# Patient Record
Sex: Female | Born: 1960 | Race: White | Hispanic: No | Marital: Married | Smoking: Current every day smoker
Health system: Southern US, Community
[De-identification: ages and names within clinical notes are randomized; demographics above are authoritative.]

## PROBLEM LIST (undated history)

## (undated) DIAGNOSIS — M2550 Pain in unspecified joint: Secondary | ICD-10-CM

## (undated) DIAGNOSIS — M2540 Effusion, unspecified joint: Secondary | ICD-10-CM

## (undated) DIAGNOSIS — M62838 Other muscle spasm: Secondary | ICD-10-CM

## (undated) DIAGNOSIS — Z8619 Personal history of other infectious and parasitic diseases: Secondary | ICD-10-CM

---

## 2005-02-14 ENCOUNTER — Emergency Department (HOSPITAL_COMMUNITY): Admission: EM | Admit: 2005-02-14 | Discharge: 2005-02-14 | Payer: Self-pay | Admitting: Family Medicine

## 2013-07-11 DIAGNOSIS — Z8619 Personal history of other infectious and parasitic diseases: Secondary | ICD-10-CM

## 2013-07-11 HISTORY — DX: Personal history of other infectious and parasitic diseases: Z86.19

## 2015-08-16 ENCOUNTER — Emergency Department (HOSPITAL_COMMUNITY): Payer: Self-pay

## 2015-08-16 ENCOUNTER — Emergency Department (HOSPITAL_COMMUNITY)
Admission: EM | Admit: 2015-08-16 | Discharge: 2015-08-16 | Disposition: A | Discharge status: Home / Self Care | Payer: Self-pay | Attending: Emergency Medicine | Admitting: Emergency Medicine

## 2015-08-16 ENCOUNTER — Encounter (HOSPITAL_COMMUNITY): Payer: Self-pay | Admitting: Physical Medicine and Rehabilitation

## 2015-08-16 DIAGNOSIS — Y999 Unspecified external cause status: Secondary | ICD-10-CM | POA: Insufficient documentation

## 2015-08-16 DIAGNOSIS — M25519 Pain in unspecified shoulder: Secondary | ICD-10-CM

## 2015-08-16 DIAGNOSIS — S42291A Other displaced fracture of upper end of right humerus, initial encounter for closed fracture: Secondary | ICD-10-CM | POA: Insufficient documentation

## 2015-08-16 DIAGNOSIS — W010XXA Fall on same level from slipping, tripping and stumbling without subsequent striking against object, initial encounter: Secondary | ICD-10-CM | POA: Insufficient documentation

## 2015-08-16 DIAGNOSIS — S42301A Unspecified fracture of shaft of humerus, right arm, initial encounter for closed fracture: Secondary | ICD-10-CM

## 2015-08-16 DIAGNOSIS — Y92009 Unspecified place in unspecified non-institutional (private) residence as the place of occurrence of the external cause: Secondary | ICD-10-CM | POA: Insufficient documentation

## 2015-08-16 DIAGNOSIS — Y9301 Activity, walking, marching and hiking: Secondary | ICD-10-CM | POA: Insufficient documentation

## 2015-08-16 DIAGNOSIS — F17210 Nicotine dependence, cigarettes, uncomplicated: Secondary | ICD-10-CM | POA: Insufficient documentation

## 2015-08-16 MED ORDER — FENTANYL CITRATE (PF) 100 MCG/2ML IJ SOLN
100.0000 ug | Freq: Once | INTRAMUSCULAR | Status: AC
  Administered 2015-08-16: 100 ug via INTRAVENOUS
  Filled 2015-08-16: qty 2

## 2015-08-16 MED ORDER — OXYCODONE HCL 5 MG PO TABS: 5.0000 mg | ORAL_TABLET | ORAL | Status: DC | PRN

## 2015-08-16 MED ORDER — HYDROMORPHONE HCL 1 MG/ML IJ SOLN
1.0000 mg | Freq: Once | INTRAMUSCULAR | Status: AC
  Administered 2015-08-16: 1 mg via INTRAVENOUS
  Filled 2015-08-16: qty 1

## 2015-08-16 MED ORDER — OXYCODONE HCL 5 MG PO TABS
10.0000 mg | ORAL_TABLET | Freq: Once | ORAL | Status: AC
  Administered 2015-08-16: 10 mg via ORAL
  Filled 2015-08-16: qty 2

## 2015-08-16 MED ORDER — SODIUM CHLORIDE 0.9 % IV BOLUS (SEPSIS)
1000.0000 mL | Freq: Once | INTRAVENOUS | Status: AC
  Administered 2015-08-16: 1000 mL via INTRAVENOUS

## 2015-08-16 MED ORDER — DIAZEPAM 5 MG/ML IJ SOLN
2.5000 mg | Freq: Once | INTRAMUSCULAR | Status: AC
  Administered 2015-08-16: 2.5 mg via INTRAVENOUS
  Filled 2015-08-16: qty 2

## 2015-08-16 MED ORDER — DIAZEPAM 2 MG PO TABS: 2.0000 mg | ORAL_TABLET | Freq: Four times a day (QID) | ORAL | Status: DC | PRN

## 2015-08-16 NOTE — ED Notes (Signed)
Pt reports she accidentally tripped and fell on while walking inside of house. Denies LOC. Now reports R upper arm pain. Able to wiggle digits. Capillary refill less than 2 seconds. Pt is alert and oriented x4.

## 2015-08-16 NOTE — ED Notes (Signed)
Off unit with xray. 

## 2015-08-16 NOTE — ED Provider Notes (Signed)
CSN: [id number]     Arrival date & time 08/16/15  9562 History   First MD Initiated Contact with Patient 08/16/15 0940     Chief Complaint  Patient presents with  . Fall  . Arm Pain     (Consider location/radiation/quality/duration/timing/severity/associated sxs/prior Treatment) Patient is a 55 y.o. female presenting with shoulder pain.  Shoulder Pain Location:  Shoulder Injury: yes   Shoulder location:  R shoulder Pain details:    Radiates to:  R elbow   Severity:  Mild   Onset quality:  Sudden   Timing:  Constant Chronicity:  New Prior injury to area:  No Associated symptoms: no fatigue and no fever     History reviewed. No pertinent past medical history. History reviewed. No pertinent past surgical history. No family history on file. Social History  Substance Use Topics  . Smoking status: Current Every Day Smoker    Types: Cigarettes  . Smokeless tobacco: None  . Alcohol Use: No   OB History    No data available     Review of Systems  Constitutional: Negative for fever and fatigue.  Musculoskeletal:       Right shoulder pain  All other systems reviewed and are negative.     Allergies  Review of patient's allergies indicates no known allergies.  Home Medications   Prior to Admission medications   Medication Sig Start Date End Date Taking? Authorizing Provider  acetaminophen (TYLENOL) 325 MG tablet Take 650 mg by mouth every 6 (six) hours as needed for mild pain.   Yes Historical Provider, MD  diazepam (VALIUM) 2 MG tablet Take 1 tablet (2 mg total) by mouth every 6 (six) hours as needed for muscle spasms. 08/16/15   Marily Memos, MD  oxyCODONE (ROXICODONE) 5 MG immediate release tablet Take 1-2 tablets (5-10 mg total) by mouth every 4 (four) hours as needed for severe pain. 08/16/15   Marily Memos, MD   BP 146/93 mmHg  Pulse 85  Temp(Src) 97.5 F (36.4 C) (Oral)  Resp 18  Ht  (1.626 m)  Wt 200 lb (90.719 kg)  BMI 34.31 kg/m2  SpO2 93% Physical  Exam  Constitutional: She appears well-developed and well-nourished.  HENT:  Head: Normocephalic and atraumatic.  Neck: Normal range of motion.  Cardiovascular: Normal rate and regular rhythm.   Pulmonary/Chest: Effort normal. No stridor. No respiratory distress.  Abdominal: Soft. She exhibits no distension.  Musculoskeletal: She exhibits tenderness (anterior right shoulder, worse with ROM, possibly slight deformity unclear if intentional or anatomic , no ttp rest of arm past mid humerus). She exhibits no edema.  Neurological: She is alert.  Nursing note and vitals reviewed.   ED Course  Procedures (including critical care time) Labs Review Labs Reviewed - No data to display  Imaging Review Dg Shoulder Right  08/16/2015  CLINICAL DATA:  reports she accidentally tripped and fell on while walking inside of house EXAM: RIGHT SHOULDER - 2+ VIEW COMPARISON:  None. FINDINGS: Oblique fracture through the surgical neck of the proximal RIGHT humerus. The glenohumeral joint appears intact. There is mild override of the fracture fragments. IMPRESSION: Oblique fracture of the proximal humeral neck. Electronically Signed   By: Genevive Bi M.D.   On: 08/16/2015 11:35   Dg Humerus Right  08/16/2015  CLINICAL DATA:  Patient status post fall on ice.  Initial encounter. EXAM: RIGHT HUMERUS - 2+ VIEW COMPARISON:  None. FINDINGS: [id number] foreshortened and displaced comminuted fracture through the proximal right humeral  diaphysis. The distal humerus appears unremarkable. Visualize right hemi thorax is unremarkable. IMPRESSION: Foreshortened displaced comminuted proximal right humerus fracture. Electronically Signed   By: Annia Belt M.D.   On: 08/16/2015 11:33   I have personally reviewed and evaluated these images and lab results as part of my medical decision-making.   EKG Interpretation None      MDM   Final diagnoses:  Shoulder pain  Humerus fracture, right, closed, initial encounter   fx  with splinting vs dislocation. NVI.  xr w/ e/o fracture. Still NVI, normal sensation, able to move all joints. D/W orthopedics who wants to see the patient in the office this week for follow up.     Marily Memos, MD 08/16/15 1247

## 2015-08-16 NOTE — ED Notes (Signed)
Pt verbalizes understandiung of instructions. hom e by Derek Jack

## 2015-08-18 ENCOUNTER — Other Ambulatory Visit (HOSPITAL_COMMUNITY): Payer: Self-pay | Admitting: Orthopedic Surgery

## 2015-08-24 ENCOUNTER — Encounter (HOSPITAL_COMMUNITY)
Admission: RE | Admit: 2015-08-24 | Discharge: 2015-08-24 | Disposition: A | Discharge status: Home / Self Care | Payer: Self-pay | Source: Ambulatory Visit | Attending: Orthopedic Surgery | Admitting: Orthopedic Surgery

## 2015-08-24 ENCOUNTER — Encounter (HOSPITAL_COMMUNITY): Payer: Self-pay

## 2015-08-24 DIAGNOSIS — X58XXXA Exposure to other specified factors, initial encounter: Secondary | ICD-10-CM | POA: Insufficient documentation

## 2015-08-24 DIAGNOSIS — Z01812 Encounter for preprocedural laboratory examination: Secondary | ICD-10-CM | POA: Insufficient documentation

## 2015-08-24 DIAGNOSIS — S42201A Unspecified fracture of upper end of right humerus, initial encounter for closed fracture: Secondary | ICD-10-CM | POA: Insufficient documentation

## 2015-08-24 HISTORY — DX: Effusion, unspecified joint: M25.40

## 2015-08-24 HISTORY — DX: Other muscle spasm: M62.838

## 2015-08-24 HISTORY — DX: Personal history of other infectious and parasitic diseases: Z86.19

## 2015-08-24 HISTORY — DX: Pain in unspecified joint: M25.50

## 2015-08-24 LAB — BASIC METABOLIC PANEL
Anion gap: 13 (ref 5–15)
BUN: 10 mg/dL (ref 6–20)
CHLORIDE: 103 mmol/L (ref 101–111)
CO2: 26 mmol/L (ref 22–32)
CREATININE: 0.64 mg/dL (ref 0.44–1.00)
Calcium: 9.7 mg/dL (ref 8.9–10.3)
GFR calc Af Amer: >60 mL/min (ref >60)
GFR calc non Af Amer: >60 mL/min (ref >60)
GLUCOSE: 104 mg/dL — AB (ref 65–99)
Potassium: 4 mmol/L (ref 3.5–5.1)
SODIUM: 142 mmol/L (ref 135–145)

## 2015-08-24 LAB — CBC
HCT: 40.1 % (ref 36.0–46.0)
Hemoglobin: 12.9 g/dL (ref 12.0–15.0)
MCH: 26.8 pg (ref 26.0–34.0)
MCHC: 32.2 g/dL (ref 30.0–36.0)
MCV: 83.2 fL (ref 78.0–100.0)
PLATELETS: 413 10*3/uL — AB (ref 150–400)
RBC: 4.82 MIL/uL (ref 3.87–5.11)
RDW: 14.1 % (ref 11.5–15.5)
WBC: 12.4 10*3/uL — ABNORMAL HIGH (ref 4.0–10.5)

## 2015-08-24 MED ORDER — CEFAZOLIN SODIUM-DEXTROSE 2-3 GM-% IV SOLR
2.0000 g | INTRAVENOUS | Status: AC
  Administered 2015-08-25: 2 g via INTRAVENOUS
  Filled 2015-08-24: qty 50

## 2015-08-24 MED ORDER — CHLORHEXIDINE GLUCONATE 4 % EX LIQD: 60.0000 mL | Freq: Once | CUTANEOUS | Status: DC

## 2015-08-24 NOTE — Anesthesia Preprocedure Evaluation (Addendum)
Anesthesia Evaluation  Patient identified by MRN, date of birth, ID band Patient awake    Reviewed: Allergy & Precautions, NPO status , Patient's Chart, lab work & pertinent test results  Airway Mallampati: III  TM Distance: >3 FB Neck ROM: Full    Dental  (+) Teeth Intact   Pulmonary Current Smoker,    breath sounds clear to auscultation       Cardiovascular negative cardio ROS   Rhythm:Regular Rate:Normal     Neuro/Psych negative neurological ROS  negative psych ROS   GI/Hepatic negative GI ROS, Neg liver ROS,   Endo/Other  negative endocrine ROS  Renal/GU negative Renal ROS  negative genitourinary   Musculoskeletal negative musculoskeletal ROS (+)   Abdominal   Peds negative pediatric ROS (+)  Hematology negative hematology ROS (+)   Anesthesia Other Findings   Reproductive/Obstetrics negative OB ROS                            Lab Results  Component Value Date   WBC 12.4* 08/24/2015   HGB 12.9 08/24/2015   HCT 40.1 08/24/2015   MCV 83.2 08/24/2015   PLT 413* 08/24/2015   Lab Results  Component Value Date   CREATININE 0.64 08/24/2015   BUN 10 08/24/2015   NA 142 08/24/2015   K 4.0 08/24/2015   CL 103 08/24/2015   CO2 26 08/24/2015   No results found for: INR, PROTIME   Anesthesia Physical Anesthesia Plan  ASA: III  Anesthesia Plan: General and Regional   Post-op Pain Management: GA combined w/ Regional for post-op pain   Induction: Intravenous  Airway Management Planned: Oral ETT  Additional Equipment:   Intra-op Plan:   Post-operative Plan: Extubation in OR  Informed Consent: I have reviewed the patients History and Physical, chart, labs and discussed the procedure including the risks, benefits and alternatives for the proposed anesthesia with the patient or authorized representative who has indicated his/her understanding and acceptance.   Dental  advisory given  Plan Discussed with: CRNA  Anesthesia Plan Comments:         Anesthesia Quick Evaluation

## 2015-08-24 NOTE — Progress Notes (Signed)
Cardiologist denies  Doesn't have a primary physician  Echo denies  Stress test denies  Heart cath denies  EKG denies in past yr  CXR denies in past yr

## 2015-08-24 NOTE — Pre-Procedure Instructions (Signed)
Deborah Kramer  08/24/2015      [id number] - [place], [place] - [address] [facility] [address] Phone: [phone] Fax: [phone]    Your procedure is scheduled on Tues, Feb 14 @ 7:30 AM  Report to [facility] Admitting at 5:30 AM  Call this number if you have problems the morning of surgery:  [phone]   Remember:  Do not eat food or drink liquids after midnight.  Take these medicines the morning of surgery with A SIP OF WATER Valium(Diazepam) and Pain Pill(if needed)               No Goody's,BC's,Aleve,Aspirin,Ibuprofen,Advil,Motrin,Fish Oil,or any Herbal Medications.    Do not wear jewelry, make-up or nail polish.  Do not wear lotions, powders, or perfumes.  You may wear deodorant.  Do not shave 48 hours prior to surgery.    Do not bring valuables to the hospital.  [facility] is not responsible for any belongings or valuables.  Contacts, dentures or bridgework may not be worn into surgery.  Leave your suitcase in the car.  After surgery it may be brought to your room.  For patients admitted to the hospital, discharge time will be determined by your treatment team.  Patients discharged the day of surgery will not be allowed to drive home.    Special instructions:  [facility] - Preparing for Surgery  Before surgery, you can play an important role.  Because skin is not sterile, your skin needs to be as free of germs as possible.  You can reduce the number of germs on you skin by washing with CHG (chlorahexidine gluconate) soap before surgery.  CHG is an antiseptic cleaner which kills germs and bonds with the skin to continue killing germs even after washing.  Please DO NOT use if you have an allergy to CHG or antibacterial soaps.  If your skin becomes reddened/irritated stop using the CHG and inform your nurse when you arrive at Short Stay.  Do not shave (including legs and underarms) for at least 48 hours prior to the  first CHG shower.  You may shave your face.  Please follow these instructions carefully:   1.  Shower with CHG Soap the night before surgery and the                                morning of Surgery.  2.  If you choose to wash your hair, wash your hair first as usual with your       normal shampoo.  3.  After you shampoo, rinse your hair and body thoroughly to remove the                      Shampoo.  4.  Use CHG as you would any other liquid soap.  You can apply chg directly       to the skin and wash gently with scrungie or a clean washcloth.  5.  Apply the CHG Soap to your body ONLY FROM THE NECK DOWN.        Do not use on open wounds or open sores.  Avoid contact with your eyes,       ears, mouth and genitals (private parts).  Wash genitals (private parts)       with your normal soap.  6.  Wash thoroughly, paying special attention  to the area where your surgery        will be performed.  7.  Thoroughly rinse your body with warm water from the neck down.  8.  DO NOT shower/wash with your normal soap after using and rinsing off       the CHG Soap.  9.  Pat yourself dry with a clean towel.            10.  Wear clean pajamas.            11.  Place clean sheets on your bed the night of your first shower and do not        sleep with pets.  Day of Surgery  Do not apply any lotions/deoderants the morning of surgery.  Please wear clean clothes to the hospital/surgery center.    Please read over the following fact sheets that you were given. Pain Booklet, Coughing and Deep Breathing and Surgical Site Infection Prevention

## 2015-08-25 ENCOUNTER — Encounter (HOSPITAL_COMMUNITY)
Admission: RE | Disposition: A | Discharge status: Home / Self Care | Payer: Self-pay | Source: Ambulatory Visit | Attending: Orthopedic Surgery

## 2015-08-25 ENCOUNTER — Ambulatory Visit (HOSPITAL_COMMUNITY): Payer: Self-pay

## 2015-08-25 ENCOUNTER — Ambulatory Visit (HOSPITAL_COMMUNITY): Payer: Self-pay | Admitting: Anesthesiology

## 2015-08-25 ENCOUNTER — Ambulatory Visit (HOSPITAL_COMMUNITY)
Admission: RE | Admit: 2015-08-25 | Discharge: 2015-08-25 | Disposition: A | Discharge status: Home / Self Care | Payer: Self-pay | Source: Ambulatory Visit | Attending: Orthopedic Surgery | Admitting: Orthopedic Surgery

## 2015-08-25 ENCOUNTER — Encounter (HOSPITAL_COMMUNITY): Payer: Self-pay

## 2015-08-25 DIAGNOSIS — W000XXA Fall on same level due to ice and snow, initial encounter: Secondary | ICD-10-CM | POA: Insufficient documentation

## 2015-08-25 DIAGNOSIS — F17210 Nicotine dependence, cigarettes, uncomplicated: Secondary | ICD-10-CM | POA: Insufficient documentation

## 2015-08-25 DIAGNOSIS — S49091A Other physeal fracture of upper end of humerus, right arm, initial encounter for closed fracture: Secondary | ICD-10-CM | POA: Insufficient documentation

## 2015-08-25 DIAGNOSIS — Z791 Long term (current) use of non-steroidal anti-inflammatories (NSAID): Secondary | ICD-10-CM | POA: Insufficient documentation

## 2015-08-25 DIAGNOSIS — Z419 Encounter for procedure for purposes other than remedying health state, unspecified: Secondary | ICD-10-CM

## 2015-08-25 HISTORY — PX: ORIF HUMERUS FRACTURE: SHX2126

## 2015-08-25 SURGERY — OPEN REDUCTION INTERNAL FIXATION (ORIF) PROXIMAL HUMERUS FRACTURE
Anesthesia: Regional | Site: Shoulder | Laterality: Right

## 2015-08-25 MED ORDER — DIAZEPAM 2 MG PO TABS: 5.0000 mg | ORAL_TABLET | Freq: Three times a day (TID) | ORAL | Status: AC | PRN

## 2015-08-25 MED ORDER — PROPOFOL 10 MG/ML IV BOLUS
INTRAVENOUS | Status: AC
  Filled 2015-08-25: qty 40

## 2015-08-25 MED ORDER — PHENYLEPHRINE 40 MCG/ML (10ML) SYRINGE FOR IV PUSH (FOR BLOOD PRESSURE SUPPORT)
PREFILLED_SYRINGE | INTRAVENOUS | Status: AC
  Filled 2015-08-25: qty 10

## 2015-08-25 MED ORDER — HYDROMORPHONE HCL 1 MG/ML IJ SOLN
INTRAMUSCULAR | Status: AC
  Administered 2015-08-25: 0.5 mg via INTRAVENOUS
  Filled 2015-08-25: qty 1

## 2015-08-25 MED ORDER — PROPOFOL 10 MG/ML IV BOLUS
INTRAVENOUS | Status: DC | PRN
  Administered 2015-08-25 (×3): 10 mg via INTRAVENOUS
  Administered 2015-08-25: 150 mg via INTRAVENOUS
  Administered 2015-08-25: 50 mg via INTRAVENOUS
  Administered 2015-08-25 (×6): 10 mg via INTRAVENOUS

## 2015-08-25 MED ORDER — EPHEDRINE SULFATE 50 MG/ML IJ SOLN
INTRAMUSCULAR | Status: AC
  Filled 2015-08-25: qty 1

## 2015-08-25 MED ORDER — PROMETHAZINE HCL 25 MG/ML IJ SOLN: 6.2500 mg | INTRAMUSCULAR | Status: DC | PRN

## 2015-08-25 MED ORDER — OXYCODONE HCL 5 MG PO TABS: 5.0000 mg | ORAL_TABLET | ORAL | Status: AC | PRN

## 2015-08-25 MED ORDER — ROCURONIUM BROMIDE 50 MG/5ML IV SOLN
INTRAVENOUS | Status: AC
  Filled 2015-08-25: qty 2

## 2015-08-25 MED ORDER — PHENYLEPHRINE HCL 10 MG/ML IJ SOLN
INTRAMUSCULAR | Status: DC | PRN
  Administered 2015-08-25 (×4): 40 ug via INTRAVENOUS
  Administered 2015-08-25: 80 ug via INTRAVENOUS
  Administered 2015-08-25: 40 ug via INTRAVENOUS
  Administered 2015-08-25: 80 ug via INTRAVENOUS
  Administered 2015-08-25 (×2): 40 ug via INTRAVENOUS
  Administered 2015-08-25: 80 ug via INTRAVENOUS

## 2015-08-25 MED ORDER — STERILE WATER FOR INJECTION IJ SOLN
INTRAMUSCULAR | Status: AC
  Filled 2015-08-25: qty 10

## 2015-08-25 MED ORDER — 0.9 % SODIUM CHLORIDE (POUR BTL) OPTIME
TOPICAL | Status: DC | PRN
  Administered 2015-08-25 (×2): 1000 mL

## 2015-08-25 MED ORDER — BUPIVACAINE HCL (PF) 0.25 % IJ SOLN
INTRAMUSCULAR | Status: AC
  Filled 2015-08-25: qty 30

## 2015-08-25 MED ORDER — DEXAMETHASONE SODIUM PHOSPHATE 10 MG/ML IJ SOLN
INTRAMUSCULAR | Status: DC | PRN
  Administered 2015-08-25: 6 mg via INTRAVENOUS

## 2015-08-25 MED ORDER — ALBUTEROL SULFATE HFA 108 (90 BASE) MCG/ACT IN AERS
INHALATION_SPRAY | RESPIRATORY_TRACT | Status: AC
  Filled 2015-08-25: qty 6.7

## 2015-08-25 MED ORDER — LACTATED RINGERS IV SOLN
INTRAVENOUS | Status: DC | PRN
  Administered 2015-08-25 (×2): via INTRAVENOUS

## 2015-08-25 MED ORDER — MEPERIDINE HCL 25 MG/ML IJ SOLN: 6.2500 mg | INTRAMUSCULAR | Status: DC | PRN

## 2015-08-25 MED ORDER — FENTANYL CITRATE (PF) 100 MCG/2ML IJ SOLN
INTRAMUSCULAR | Status: DC | PRN
  Administered 2015-08-25: 50 ug via INTRAVENOUS
  Administered 2015-08-25: 100 ug via INTRAVENOUS

## 2015-08-25 MED ORDER — LACTATED RINGERS IV SOLN: INTRAVENOUS | Status: DC

## 2015-08-25 MED ORDER — OXYCODONE HCL 5 MG PO TABS
ORAL_TABLET | ORAL | Status: DC
Start: 2015-08-25 — End: 2015-08-25
  Filled 2015-08-25: qty 2

## 2015-08-25 MED ORDER — FENTANYL CITRATE (PF) 250 MCG/5ML IJ SOLN
INTRAMUSCULAR | Status: AC
  Filled 2015-08-25: qty 5

## 2015-08-25 MED ORDER — NEOSTIGMINE METHYLSULFATE 10 MG/10ML IV SOLN
INTRAVENOUS | Status: AC
  Filled 2015-08-25: qty 1

## 2015-08-25 MED ORDER — ROCURONIUM BROMIDE 50 MG/5ML IV SOLN
INTRAVENOUS | Status: AC
  Filled 2015-08-25: qty 1

## 2015-08-25 MED ORDER — EPHEDRINE SULFATE 50 MG/ML IJ SOLN
INTRAMUSCULAR | Status: DC | PRN
  Administered 2015-08-25 (×2): 5 mg via INTRAVENOUS
  Administered 2015-08-25: 10 mg via INTRAVENOUS

## 2015-08-25 MED ORDER — BUPIVACAINE-EPINEPHRINE (PF) 0.5% -1:200000 IJ SOLN
INTRAMUSCULAR | Status: DC | PRN
  Administered 2015-08-25: 15 mL

## 2015-08-25 MED ORDER — OXYCODONE HCL 5 MG PO TABS
5.0000 mg | ORAL_TABLET | Freq: Once | ORAL | Status: AC
  Administered 2015-08-25: 10 mg via ORAL

## 2015-08-25 MED ORDER — PHENYLEPHRINE HCL 10 MG/ML IJ SOLN
10.0000 mg | INTRAVENOUS | Status: DC | PRN
  Administered 2015-08-25: 15 ug/min via INTRAVENOUS

## 2015-08-25 MED ORDER — ROCURONIUM BROMIDE 100 MG/10ML IV SOLN
INTRAVENOUS | Status: DC | PRN
  Administered 2015-08-25: 5 mg via INTRAVENOUS
  Administered 2015-08-25: 20 mg via INTRAVENOUS
  Administered 2015-08-25: 45 mg via INTRAVENOUS

## 2015-08-25 MED ORDER — GLYCOPYRROLATE 0.2 MG/ML IJ SOLN
INTRAMUSCULAR | Status: AC
  Filled 2015-08-25: qty 3

## 2015-08-25 MED ORDER — MIDAZOLAM HCL 5 MG/5ML IJ SOLN
INTRAMUSCULAR | Status: DC | PRN
  Administered 2015-08-25: 2 mg via INTRAVENOUS

## 2015-08-25 MED ORDER — MIDAZOLAM HCL 2 MG/2ML IJ SOLN
INTRAMUSCULAR | Status: AC
  Filled 2015-08-25: qty 2

## 2015-08-25 MED ORDER — DEXAMETHASONE SODIUM PHOSPHATE 4 MG/ML IJ SOLN
INTRAMUSCULAR | Status: AC
  Filled 2015-08-25: qty 2

## 2015-08-25 MED ORDER — GLYCOPYRROLATE 0.2 MG/ML IJ SOLN
INTRAMUSCULAR | Status: DC | PRN
  Administered 2015-08-25: .4 mg via INTRAVENOUS

## 2015-08-25 MED ORDER — ONDANSETRON HCL 4 MG/2ML IJ SOLN
INTRAMUSCULAR | Status: AC
  Filled 2015-08-25: qty 2

## 2015-08-25 MED ORDER — BUPIVACAINE HCL (PF) 0.25 % IJ SOLN
INTRAMUSCULAR | Status: DC | PRN
  Administered 2015-08-25: 30 mL

## 2015-08-25 MED ORDER — NEOSTIGMINE METHYLSULFATE 10 MG/10ML IV SOLN
INTRAVENOUS | Status: DC | PRN
  Administered 2015-08-25: 2.5 mg via INTRAVENOUS

## 2015-08-25 MED ORDER — HYDROMORPHONE HCL 1 MG/ML IJ SOLN
0.2500 mg | INTRAMUSCULAR | Status: DC | PRN
  Administered 2015-08-25 (×2): 0.5 mg via INTRAVENOUS

## 2015-08-25 SURGICAL SUPPLY — 82 items
BANDAGE ELASTIC 4 VELCRO ST LF (GAUZE/BANDAGES/DRESSINGS) IMPLANT
BANDAGE ELASTIC 6 VELCRO ST LF (GAUZE/BANDAGES/DRESSINGS) IMPLANT
BENZOIN TINCTURE PRP APPL 2/3 (GAUZE/BANDAGES/DRESSINGS) ×3 IMPLANT
BIT DRILL 3.2 (BIT) ×2
BIT DRILL 3.2XCALB NS DISP (BIT) ×1 IMPLANT
BIT DRILL CALIBRATED 2.7 (BIT) ×2 IMPLANT
BIT DRILL CALIBRATED 2.7MM (BIT) ×1
BIT DRL 3.2XCALB NS DISP (BIT) ×1
BNDG COHESIVE 4X5 TAN STRL (GAUZE/BANDAGES/DRESSINGS) ×3 IMPLANT
CLOSURE WOUND 1/2 X4 (GAUZE/BANDAGES/DRESSINGS) ×2
COVER SURGICAL LIGHT HANDLE (MISCELLANEOUS) ×3 IMPLANT
DRAIN PENROSE 1/2X12 LTX STRL (WOUND CARE) IMPLANT
DRAPE C-ARM 42X72 X-RAY (DRAPES) ×3 IMPLANT
DRAPE IMP U-DRAPE 54X76 (DRAPES) ×3 IMPLANT
DRAPE INCISE IOBAN 66X45 STRL (DRAPES) ×3 IMPLANT
DRAPE U-SHAPE 47X51 STRL (DRAPES) ×3 IMPLANT
DRSG AQUACEL AG ADV 3.5X14 (GAUZE/BANDAGES/DRESSINGS) ×3 IMPLANT
DRSG PAD ABDOMINAL 8X10 ST (GAUZE/BANDAGES/DRESSINGS) ×6 IMPLANT
DURAPREP 26ML APPLICATOR (WOUND CARE) ×3 IMPLANT
ELECT REM PT RETURN 9FT ADLT (ELECTROSURGICAL) ×3
ELECTRODE REM PT RTRN 9FT ADLT (ELECTROSURGICAL) ×1 IMPLANT
FACESHIELD WRAPAROUND (MASK) ×3 IMPLANT
GAUZE SPONGE 4X4 12PLY STRL (GAUZE/BANDAGES/DRESSINGS) IMPLANT
GAUZE XEROFORM 5X9 LF (GAUZE/BANDAGES/DRESSINGS) IMPLANT
GLOVE BIOGEL PI IND STRL 8 (GLOVE) ×1 IMPLANT
GLOVE BIOGEL PI INDICATOR 8 (GLOVE) ×2
GLOVE SURG ORTHO 8.0 STRL STRW (GLOVE) ×3 IMPLANT
GOWN STRL REUS W/ TWL LRG LVL3 (GOWN DISPOSABLE) ×2 IMPLANT
GOWN STRL REUS W/ TWL XL LVL3 (GOWN DISPOSABLE) ×1 IMPLANT
GOWN STRL REUS W/TWL LRG LVL3 (GOWN DISPOSABLE) ×4
GOWN STRL REUS W/TWL XL LVL3 (GOWN DISPOSABLE) ×2
K-WIRE 2X5 SS THRDED S3 (WIRE) ×12
KIT BASIN OR (CUSTOM PROCEDURE TRAY) ×3 IMPLANT
KIT ROOM TURNOVER OR (KITS) ×3 IMPLANT
KWIRE 2X5 SS THRDED S3 (WIRE) ×4 IMPLANT
LOOP VESSEL MAXI BLUE (MISCELLANEOUS) ×3 IMPLANT
MANIFOLD NEPTUNE II (INSTRUMENTS) ×3 IMPLANT
NEEDLE 1/2 CIR CATGUT .05X1.09 (NEEDLE) ×3 IMPLANT
NEEDLE 21X1 OR PACK (NEEDLE) IMPLANT
NEEDLE MAYO TROCAR (NEEDLE) ×3 IMPLANT
NS IRRIG 1000ML POUR BTL (IV SOLUTION) ×3 IMPLANT
PACK SHOULDER (CUSTOM PROCEDURE TRAY) ×3 IMPLANT
PACK UNIVERSAL I (CUSTOM PROCEDURE TRAY) ×3 IMPLANT
PAD ARMBOARD 7.5X6 YLW CONV (MISCELLANEOUS) ×6 IMPLANT
PAD CAST 4YDX4 CTTN HI CHSV (CAST SUPPLIES) IMPLANT
PADDING CAST COTTON 4X4 STRL (CAST SUPPLIES)
PEG LOCKING 3.2MMX44 (Peg) ×3 IMPLANT
PEG LOCKING 3.2MMX54 (Peg) ×3 IMPLANT
PEG LOCKING 3.2X36 (Screw) ×3 IMPLANT
PEG LOCKING 3.2X38 (Screw) ×6 IMPLANT
PEG LOCKING 3.2X40 (Peg) ×6 IMPLANT
PEG LOCKING 3.2X42 (Screw) ×3 IMPLANT
PEG LOCKING 3.2X52 (Peg) ×3 IMPLANT
PENCIL BUTTON HOLSTER BLD 10FT (ELECTRODE) IMPLANT
PLATE PROX HUM HI R 4H 90 (Plate) ×3 IMPLANT
SCREW LOCK CORT STAR 3.5X24 (Screw) ×3 IMPLANT
SCREW LOW PROF TIS 3.5X28MM (Screw) ×3 IMPLANT
SCREW LP NL T15 3.5X26 (Screw) ×3 IMPLANT
SLEEVE MEASURING 3.2 (BIT) ×3 IMPLANT
SPONGE GAUZE 4X4 12PLY STER LF (GAUZE/BANDAGES/DRESSINGS) ×3 IMPLANT
SPONGE LAP 4X18 X RAY DECT (DISPOSABLE) ×6 IMPLANT
STAPLER VISISTAT 35W (STAPLE) IMPLANT
STOCKINETTE IMPERVIOUS 9X36 MD (GAUZE/BANDAGES/DRESSINGS) IMPLANT
STRIP CLOSURE SKIN 1/2X4 (GAUZE/BANDAGES/DRESSINGS) ×4 IMPLANT
SUCTION FRAZIER HANDLE 10FR (MISCELLANEOUS)
SUCTION TUBE FRAZIER 10FR DISP (MISCELLANEOUS) IMPLANT
SUT MNCRL AB 3-0 PS2 18 (SUTURE) ×3 IMPLANT
SUT SILK 2 0 (SUTURE) ×2
SUT SILK 2-0 18XBRD TIE 12 (SUTURE) ×1 IMPLANT
SUT VIC AB 0 CT1 27 (SUTURE) ×4
SUT VIC AB 0 CT1 27XBRD ANBCTR (SUTURE) ×2 IMPLANT
SUT VIC AB 1 CT1 27 (SUTURE) ×6
SUT VIC AB 1 CT1 27XBRD ANBCTR (SUTURE) ×3 IMPLANT
SUT VIC AB 2-0 CT1 27 (SUTURE) ×2
SUT VIC AB 2-0 CT1 TAPERPNT 27 (SUTURE) ×1 IMPLANT
SUT VIC AB 2-0 CTB1 (SUTURE) IMPLANT
TOWEL OR 17X24 6PK STRL BLUE (TOWEL DISPOSABLE) ×3 IMPLANT
TOWEL OR 17X26 10 PK STRL BLUE (TOWEL DISPOSABLE) ×3 IMPLANT
TUBE CONNECTING 12'X1/4 (SUCTIONS)
TUBE CONNECTING 12X1/4 (SUCTIONS) IMPLANT
WATER STERILE IRR 1000ML POUR (IV SOLUTION) ×3 IMPLANT
YANKAUER SUCT BULB TIP NO VENT (SUCTIONS) IMPLANT

## 2015-08-25 NOTE — Brief Op Note (Signed)
08/25/2015  10:42 AM  PATIENT:  Karielle M Elbe  55 y.o. female  PRE-OPERATIVE DIAGNOSIS:  RIGHT PROXIMAL HUMERUS FRACTURE  POST-OPERATIVE DIAGNOSIS:  RIGHT PROXIMAL HUMERUS FRACTURE  PROCEDURE:  Procedure(s): OPEN REDUCTION INTERNAL FIXATION (ORIF) PROXIMAL HUMERUS FRACTURE  SURGEON:  Surgeon(s): Cammy Copa, MD  ASSISTANT: Patrick Jupiter RNFA  ANESTHESIA:   regional and general  EBL: 150 ml    Total I/O In: 1300 [I.V.:1300] Out: 200 [Blood:200]  BLOOD ADMINISTERED: none  DRAINS: none   LOCAL MEDICATIONS USED:  none  SPECIMEN:  No Specimen  COUNTS:  YES  TOURNIQUET:  * No tourniquets in log *  DICTATION: .Other Dictation: Dictation Number [id number]  PLAN OF CARE: Discharge to home after PACU  PATIENT DISPOSITION:  PACU - hemodynamically stable

## 2015-08-25 NOTE — H&P (Signed)
Deborah Kramer is an 55 y.o. female.   Chief Complaint: Right shoulder pain HPI: Deborah Kramer is a 55 year old patient with right shoulder pain. She sustained an injury approximately 2-3 weeks year. She has gone to have continued motion at the fracture site with consistent continued angulation and significant displacement she presents now for operative management  Past Medical History  Diagnosis Date  . Muscle spasm     takes Valium daily as needed  . Joint pain   . Joint swelling   . History of staph infection 2015    History reviewed. No pertinent past surgical history.  History reviewed. No pertinent family history. Social History:  reports that she has been smoking Cigarettes.  She has a 35 pack-year smoking history. She does not have any smokeless tobacco history on file. She reports that she drinks alcohol. She reports that she does not use illicit drugs.  Allergies: No Known Allergies  Medications Prior to Admission  Medication Sig Dispense Refill  . acetaminophen (TYLENOL) 325 MG tablet Take 650 mg by mouth every 6 (six) hours as needed for mild pain.    . diazepam (VALIUM) 2 MG tablet Take 1 tablet (2 mg total) by mouth every 6 (six) hours as needed for muscle spasms. (Patient taking differently: Take 2 mg by mouth every 6 (six) hours as needed for muscle spasms. Pt states the last time she had it refilled it was changed to 5 mg tablets.) 30 tablet 0  . ibuprofen (ADVIL,MOTRIN) 200 MG tablet Take 200 mg by mouth 2 (two) times daily.    Marland Kitchen oxyCODONE (ROXICODONE) 5 MG immediate release tablet Take 1-2 tablets (5-10 mg total) by mouth every 4 (four) hours as needed for severe pain. 30 tablet 0    Results for orders placed or performed during the hospital encounter of 08/24/15 (from the past 48 hour(s))  Basic metabolic panel     Status: Abnormal   Collection Time: 08/24/15  3:16 PM  Result Value Ref Range   Sodium 142 135 - 145 mmol/L   Potassium 4.0 3.5 - 5.1 mmol/L   Chloride 103  101 - 111 mmol/L   CO2 26 22 - 32 mmol/L   Glucose, Bld 104 (H) 65 - 99 mg/dL   BUN 10 6 - 20 mg/dL   Creatinine, Ser 0.64 0.44 - 1.00 mg/dL   Calcium 9.7 8.9 - 10.3 mg/dL   GFR calc non Af Amer >60 >60 mL/min   GFR calc Af Amer >60 >60 mL/min    Comment: (NOTE) The eGFR has been calculated using the CKD EPI equation. This calculation has not been validated in all clinical situations. eGFR's persistently <60 mL/min signify possible Chronic Kidney Disease.    Anion gap 13 5 - 15  CBC     Status: Abnormal   Collection Time: 08/24/15  3:16 PM  Result Value Ref Range   WBC 12.4 (H) 4.0 - 10.5 K/uL   RBC 4.82 3.87 - 5.11 MIL/uL   Hemoglobin 12.9 12.0 - 15.0 g/dL   HCT 40.1 36.0 - 46.0 %   MCV 83.2 78.0 - 100.0 fL   MCH 26.8 26.0 - 34.0 pg   MCHC 32.2 30.0 - 36.0 g/dL   RDW 14.1 11.5 - 15.5 %   Platelets 413 (H) 150 - 400 K/uL   No results found.  Review of Systems  Constitutional: Negative.   HENT: Negative.   Eyes: Negative.   Cardiovascular: Negative.   Gastrointestinal: Negative.   Genitourinary: Negative.  Musculoskeletal: Positive for joint pain.  Skin: Negative.   Neurological: Negative.   Endo/Heme/Allergies: Negative.   Psychiatric/Behavioral: Negative.     Blood pressure 123/78, pulse 94, temperature 98.1 F (36.7 C), temperature source Oral, resp. rate 18, height _0  (1.626 m), weight 98.998 kg (218 lb 4 oz), SpO2 97 %. Physical Exam  Constitutional: She appears well-developed.  HENT:  Head: Normocephalic.  Eyes: Pupils are equal, round, and reactive to light.  Neck: Normal range of motion.  Cardiovascular: Normal rate.   Respiratory: Effort normal.  Neurological: She is alert.  Skin: Skin is warm.  Psychiatric: She has a normal mood and affect.   examination the right shoulder demonstrates continued motion at the fracture site with intact motor sensory function to the hand palpable radial pulse some ecchymosis of the biceps region neck range of motion  full  Assessment/Plan Impression is displaced right proximal humerus fracture plan open reduction internal fixation risk benefits discussed with the patient including but limited to infection nerve vessel damage potential for nonunion malunion and need for more surgery all questions answered  Meredith Pel, MD 08/25/2015, 7:24 AM

## 2015-08-25 NOTE — Transfer of Care (Signed)
Immediate Anesthesia Transfer of Care Note  Patient: Deborah Kramer  Procedure(s) Performed: Procedure(s): OPEN REDUCTION INTERNAL FIXATION (ORIF) PROXIMAL HUMERUS FRACTURE (Right)  Patient Location: PACU  Anesthesia Type:General and GA combined with regional for post-op pain  Level of Consciousness: awake, alert  and oriented  Airway & Oxygen Therapy: Patient Spontanous Breathing and Patient connected to nasal cannula oxygen  Post-op Assessment: Report given to RN and Post -op Vital signs reviewed and stable  Post vital signs: Reviewed and stable  Last Vitals:  Filed Vitals:   08/25/15 0642  BP: 123/78  Pulse: 94  Temp: 36.7 C  Resp: 18    Complications: No apparent anesthesia complications

## 2015-08-25 NOTE — Op Note (Signed)
NAMELAQUENTA, WHITSELL NO.:  [id number]  MEDICAL RECORD NO.:  [id number]  LOCATION:  [facility]                         FACILITY:  [facility]  PHYSICIAN:  Burnard Bunting, M.D.    DATE OF BIRTH:  Nov 25, 1960  DATE OF PROCEDURE: DATE OF DISCHARGE:                              OPERATIVE REPORT   PREOPERATIVE DIAGNOSIS:  Right proximal humerus fracture, displaced, three-part.  POSTOPERATIVE DIAGNOSIS:  Right proximal humerus fracture, displaced, three-part.  PROCEDURE:  Open reduction and internal fixation of proximal humerus fracture using Biomet plate.  SURGEON:  Burnard Bunting, M.D.  ASSISTANT:  Patrick Jupiter, RNFA  INDICATIONS:  Deborah Kramer is a 55 year old patient with right shoulder pain, displaced fracture, presents for operative management after explanation of risks and benefits.  PROCEDURE IN DETAIL:  The patient was brought to the operating room where general anesthetic was induced.  Preoperative antibiotics were administered.  Time-out was called.  Right arm, axilla and hand were prescrubbed with alcohol and Betadine, allowed to air dry, prepped with DuraPrep solution and draped in a sterile manner.  Deborah Kramer was used to cover the operative field.  Deltopectoral approach was made and extended distally.  Skin and subcu tissue were sharply divided.  Cephalic vein was mobilized medially, crossing branches were ligated using silk ligatures.  Using a Cobb elevator, deltoid attachment elevated, the anterior half was elevated in order to facilitate exposure.  Thorough irrigation performed.  Bursal-type debris was removed from the fracture site.  Fracture site was identified and mobilized.  There was a posterior fracture fragment, which was sutured with a MaxBraid suture. Plate was then applied just lateral to the bicipital tuberosity with a higher riding plate because of the proximal nature of the fracture. Fracture was reduced.  K-wire was placed through the plate into  the fracture, reduction confirmed in the AP and lateral planes under fluoroscopy.  Nonlocking screws x2 placed in the shaft in order to secure the plate to the bone.  Then, locking screws were placed proximally in the head under fluoroscopic guidance to make sure that none of the blunt edges penetrated.  At this time, a single nonlocking screw was placed into the shaft.  Good reduction was achieved.  Good stable fixation was achieved.  At this time, thorough irrigation with 3 liters of irrigating solution was performed.  Deltopectoral split closed using #1 Vicryl suture followed by interrupted inverted 0 Vicryl suture, 2-0 Vicryl suture and 3-0 Monocryl.  A waterproof dressing applied.  The patient tolerated the procedure well without immediate complication, transferred to the recovery room in stable condition.  Sling applied as well.     Burnard Bunting, M.D.     GSD/MEDQ  D:  08/25/2015  T:  08/25/2015  Job:  [id number]

## 2015-08-25 NOTE — Anesthesia Procedure Notes (Addendum)
Anesthesia Regional Block:  Interscalene brachial plexus block  Pre-Anesthetic Checklist: ,, timeout performed, Correct Patient, Correct Site, Correct Laterality, Correct Procedure, Correct Position, site marked, Risks and benefits discussed,  Surgical consent,  Pre-op evaluation,  At surgeon's request and post-op pain management  Laterality: Right  Prep: chloraprep       Needles:  Injection technique: Single-shot  Needle Type: Echogenic Needle     Needle Length: 9cm 9 cm Needle Gauge: 21 and 21 G    Additional Needles:  Procedures: ultrasound guided (picture in chart) Interscalene brachial plexus block Narrative:  Start time: 08/25/2015 7:15 AM End time: 08/25/2015 7:20 AM Injection made incrementally with aspirations every 5 mL.  Performed by: Personally  Anesthesiologist: Shona Simpson D  Additional Notes: No immediate complications noted.    Procedure Name: Intubation Date/Time: 08/25/2015 7:46 AM Performed by: Wilder Glade Pre-anesthesia Checklist: Patient identified, Emergency Drugs available, Suction available, Patient being monitored and Timeout performed Patient Re-evaluated:Patient Re-evaluated prior to inductionOxygen Delivery Method: Circle system utilized Preoxygenation: Pre-oxygenation with 100% oxygen Intubation Type: IV induction Ventilation: Mask ventilation without difficulty Laryngoscope Size: Miller and 2 Grade View: Grade I Tube type: Oral Number of attempts: 1 Placement Confirmation: ETT inserted through vocal cords under direct vision,  positive ETCO2 and breath sounds checked- equal and bilateral Secured at: 23 cm Tube secured with: Tape Dental Injury: Teeth and Oropharynx as per pre-operative assessment

## 2015-08-25 NOTE — Anesthesia Postprocedure Evaluation (Signed)
Anesthesia Post Note  Patient: Deborah Kramer  Procedure(s) Performed: Procedure(s) (LRB): OPEN REDUCTION INTERNAL FIXATION (ORIF) PROXIMAL HUMERUS FRACTURE (Right)  Patient location during evaluation: ICU Anesthesia Type: General and Regional Level of consciousness: awake and alert Pain management: pain level controlled Vital Signs Assessment: post-procedure vital signs reviewed and stable Respiratory status: spontaneous breathing, nonlabored ventilation, respiratory function stable and patient connected to nasal cannula oxygen Cardiovascular status: blood pressure returned to baseline and stable Postop Assessment: no signs of nausea or vomiting Anesthetic complications: no    Last Vitals:  Filed Vitals:   08/25/15 1200 08/25/15 1215  BP: 107/71   Pulse: 90 88  Temp:    Resp: 21 22    Last Pain:  Filed Vitals:   08/25/15 1222  PainSc: 2                  Shelton Silvas

## 2015-08-26 ENCOUNTER — Encounter (HOSPITAL_COMMUNITY): Payer: Self-pay | Admitting: Orthopedic Surgery

## 2017-03-12 IMAGING — DX DG HUMERUS 2V *R*
3 series · 3 of 3 positions shown · non-contrast
Comparison: None.

CLINICAL DATA: Patient status post fall on ice.  Initial encounter.

EXAM:
RIGHT HUMERUS - 2+ VIEW

[humerus ap (1 of 2)]
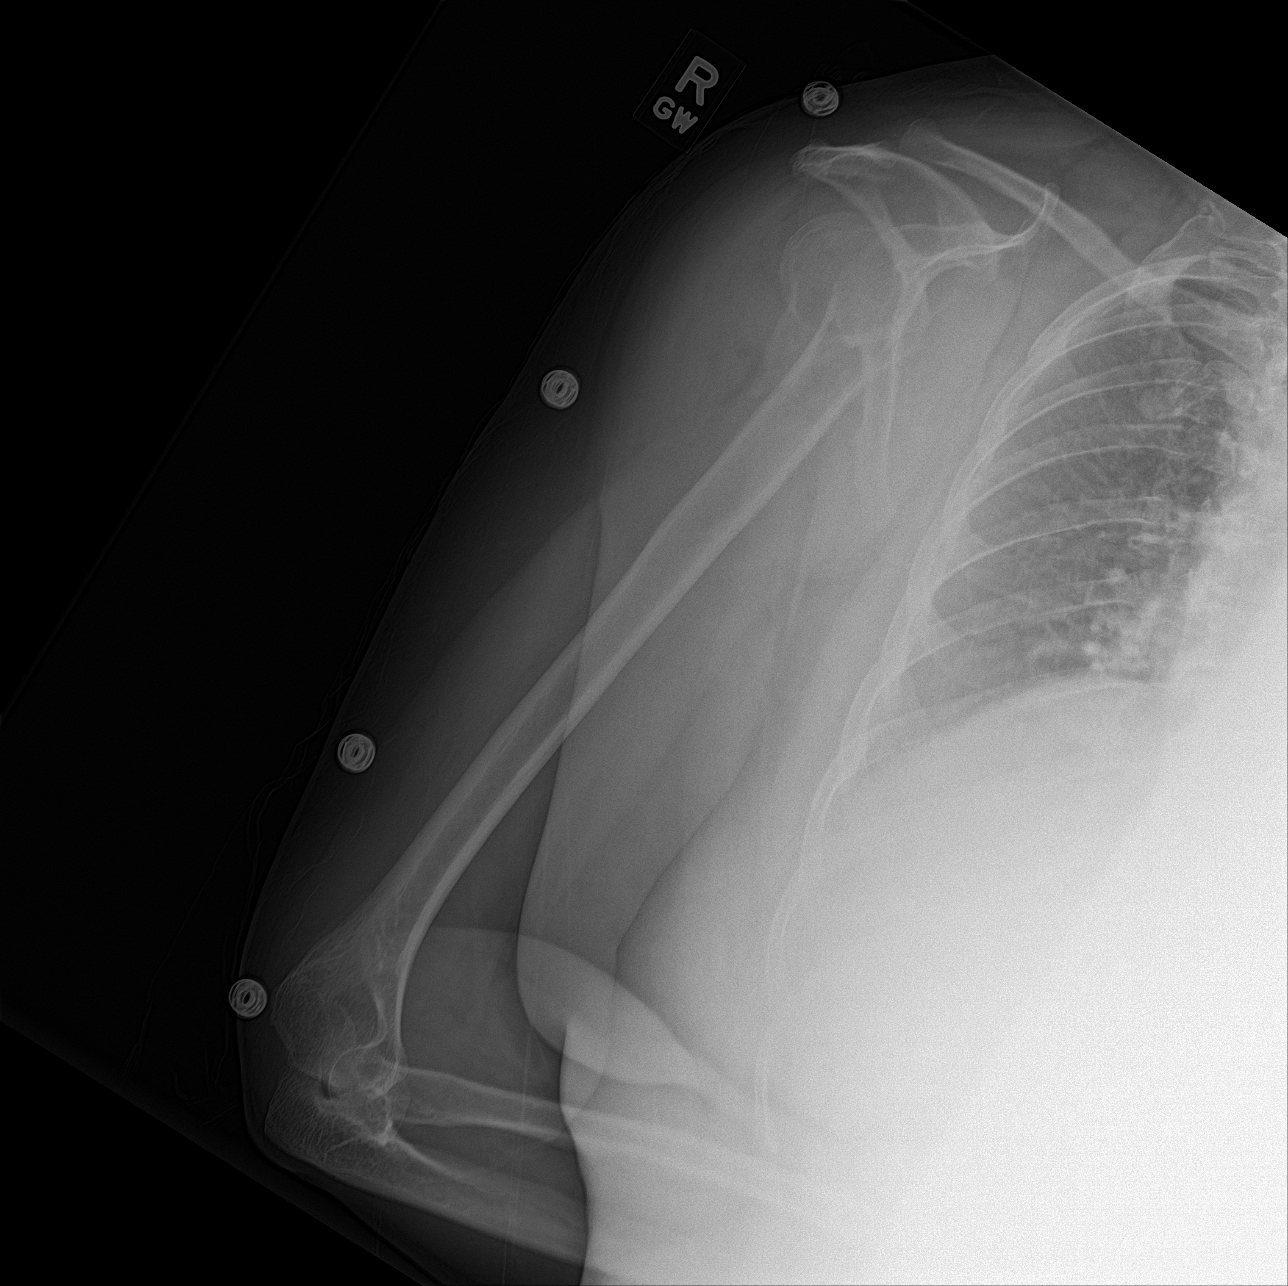

[humerus ap (2 of 2)]
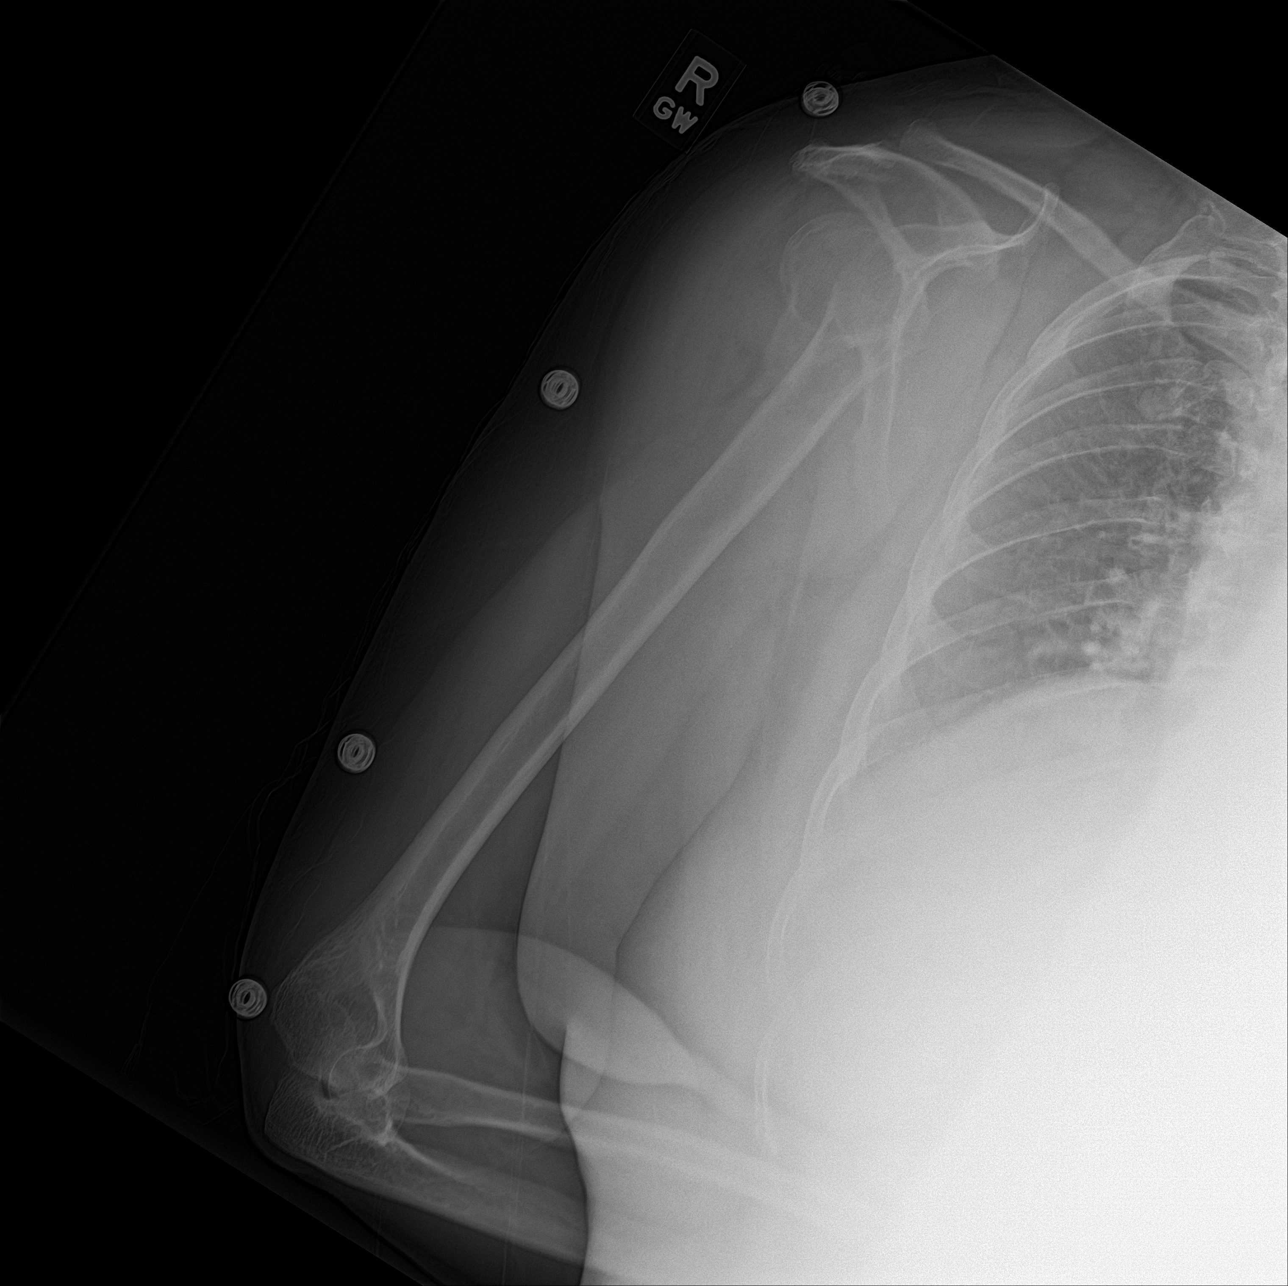

[humerus lat]
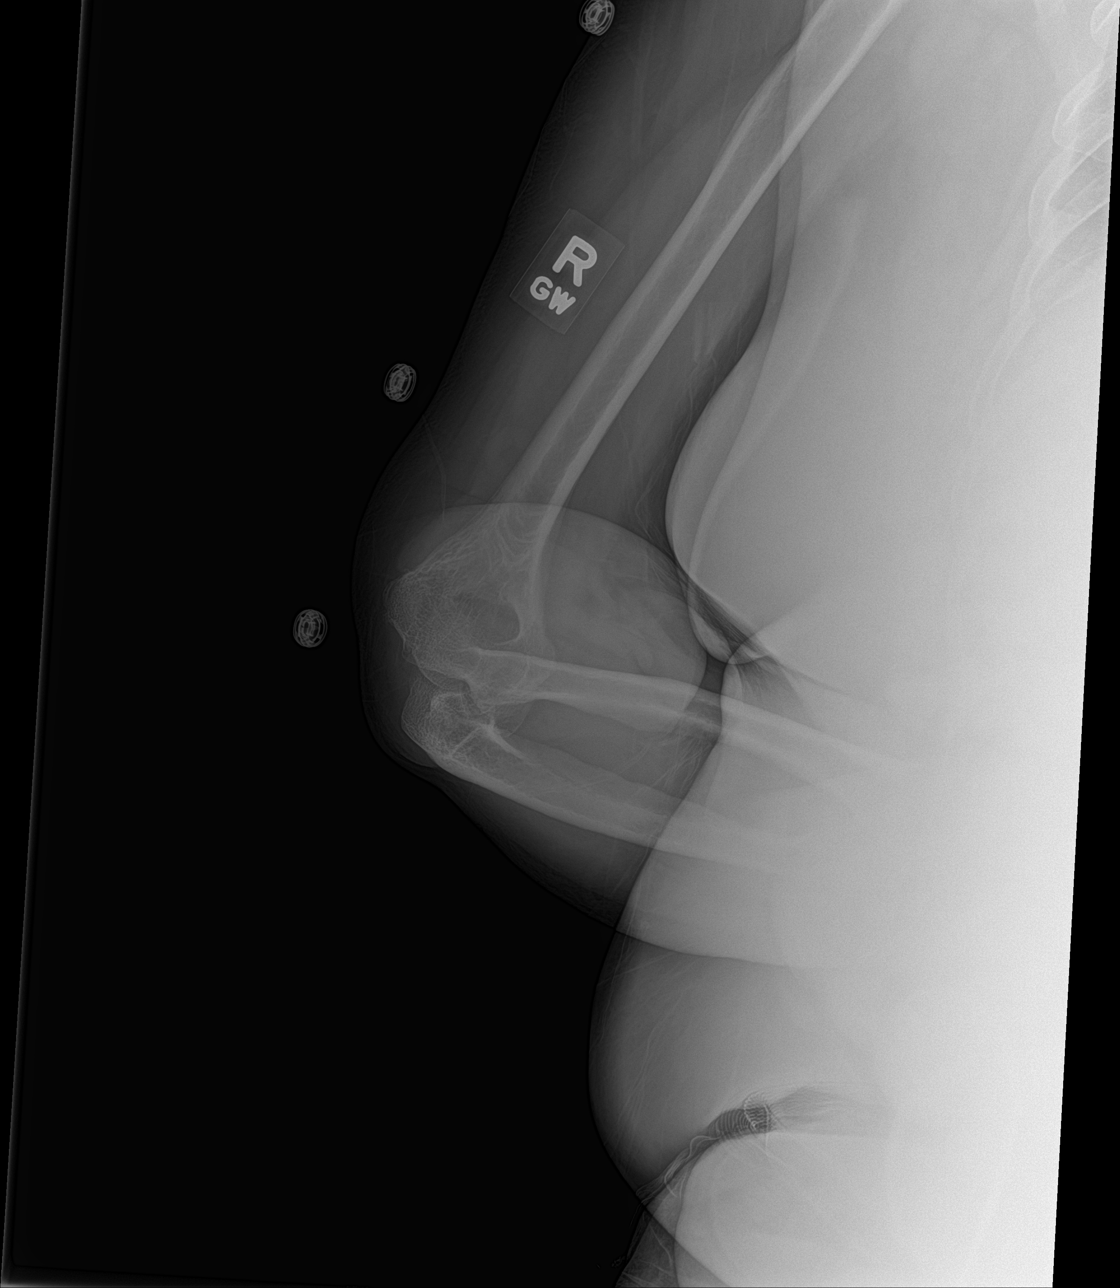

[3 of 3 positions shown; findings below may reference images not displayed]

FINDINGS: There is a foreshortened and displaced comminuted fracture through
the proximal right humeral diaphysis. The distal humerus appears
unremarkable. Visualize right hemi thorax is unremarkable.
IMPRESSION: Foreshortened displaced comminuted proximal right humerus fracture.

## 2017-03-12 IMAGING — DX DG SHOULDER 2+V*R*
3 series · 3 of 3 positions shown · non-contrast
Comparison: None.

CLINICAL DATA: reports she accidentally tripped and fell on while
walking inside of house

EXAM:
RIGHT SHOULDER - 2+ VIEW

[shoulder grashey]
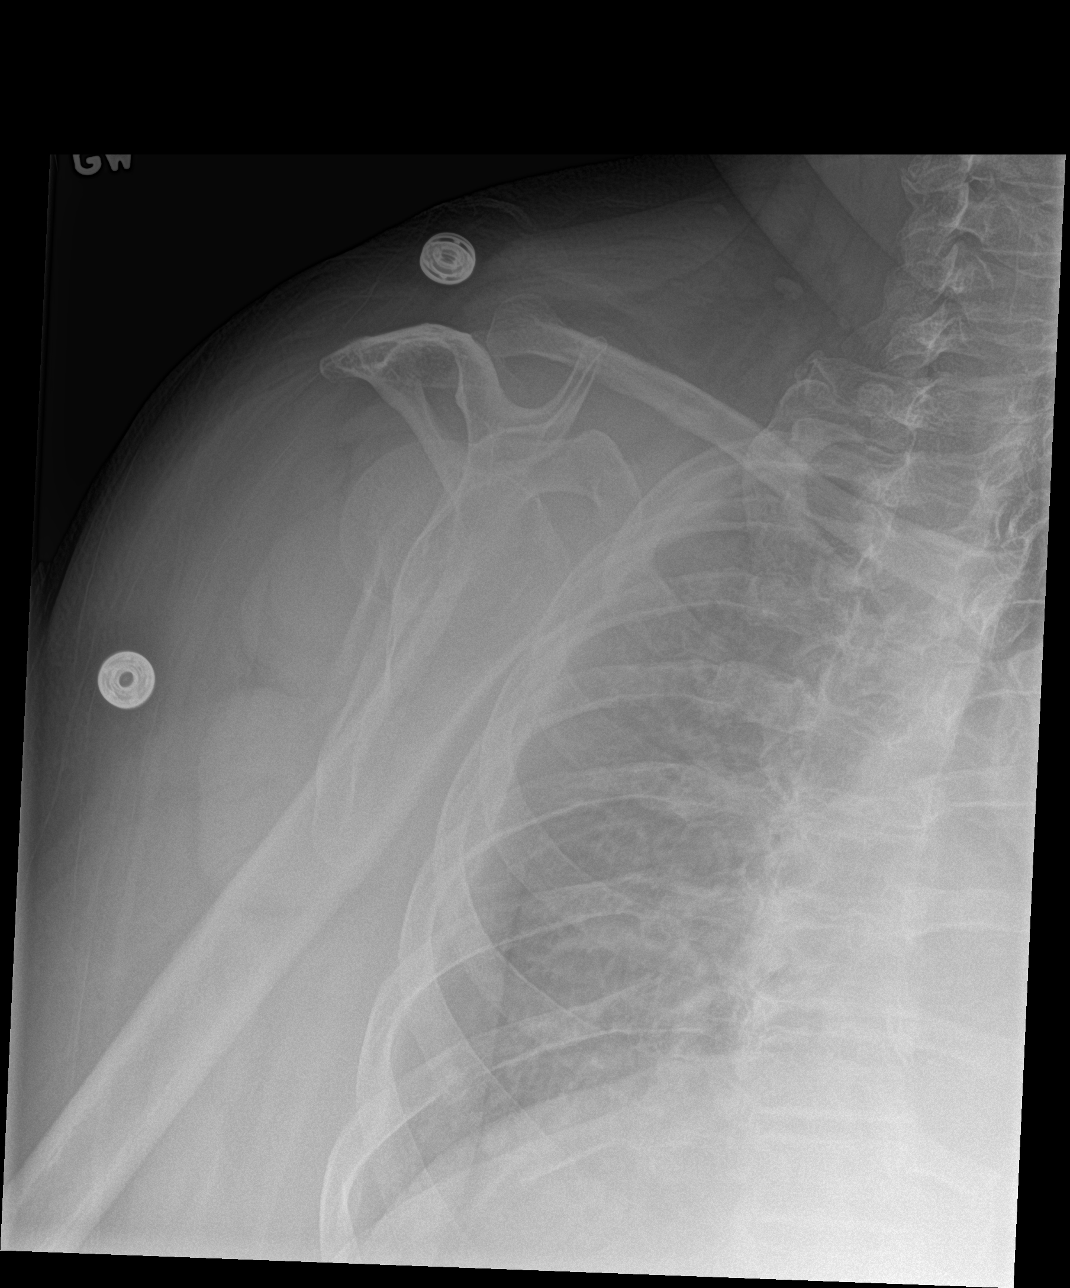

[shoulder ap neutral]
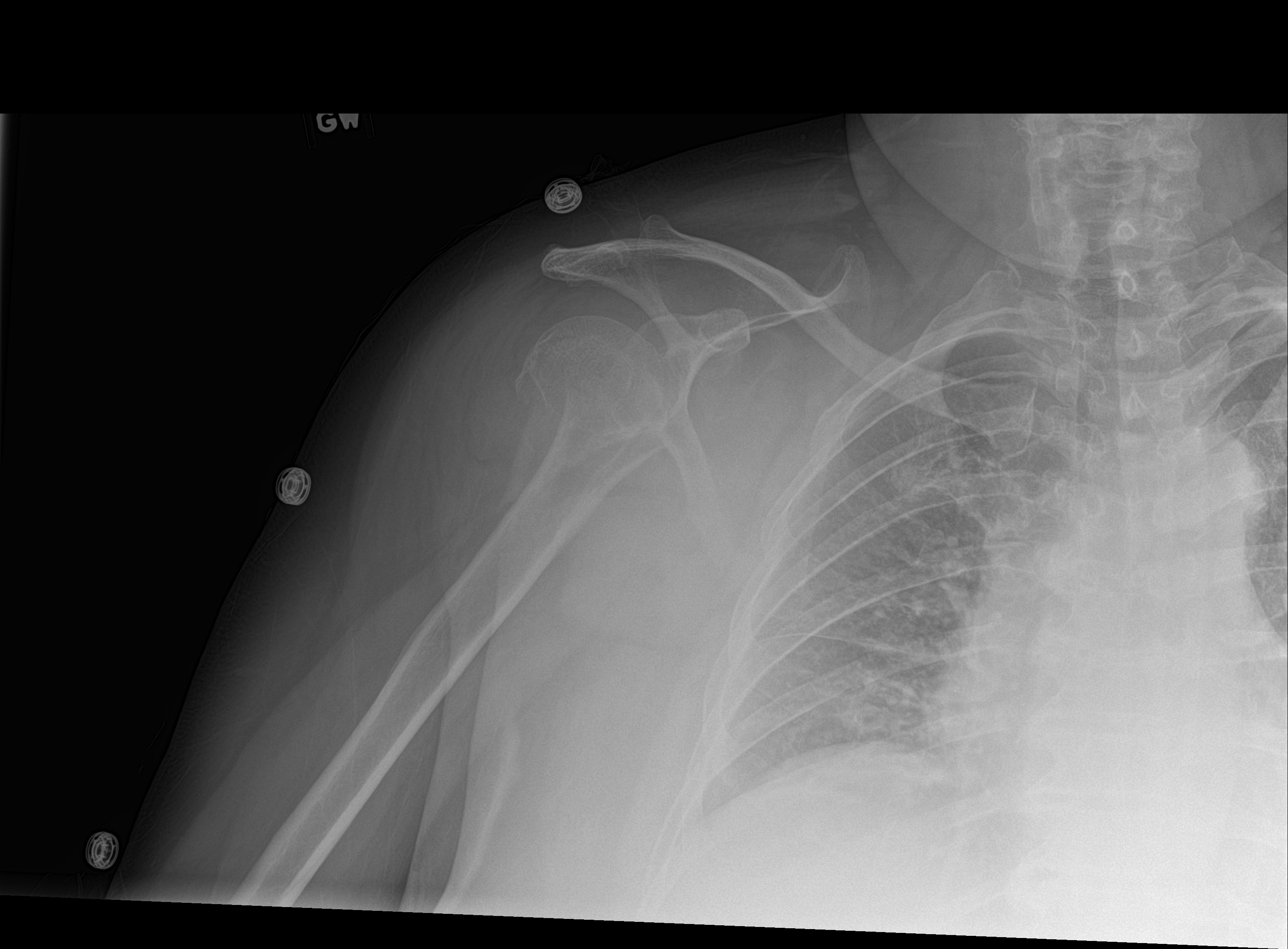

[shoulder y view]
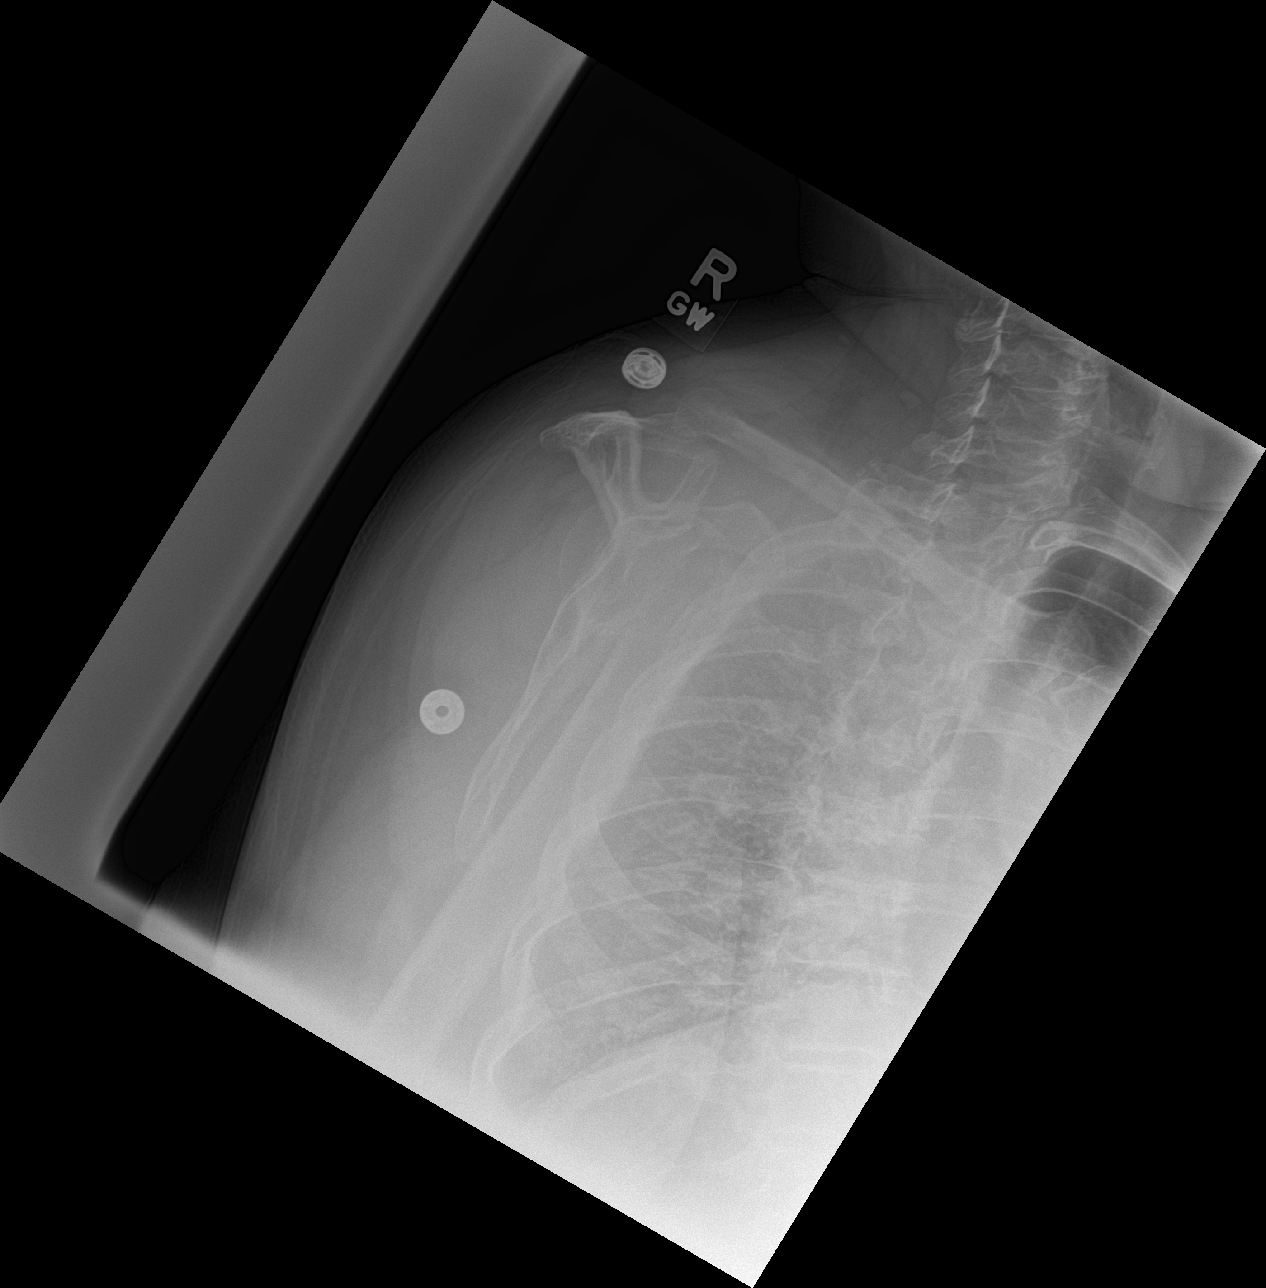

[3 of 3 positions shown; findings below may reference images not displayed]

FINDINGS: Oblique fracture through the surgical neck of the proximal RIGHT
humerus. The glenohumeral joint appears intact. There is mild
override of the fracture fragments.
IMPRESSION: Oblique fracture of the proximal humeral neck.
